# Patient Record
Sex: Male | Born: 1970 | Race: White | Hispanic: No | Marital: Single | Smoking: Never smoker
Health system: Southern US, Community
[De-identification: ages and names within clinical notes are randomized; demographics above are authoritative.]

## PROBLEM LIST (undated history)

## (undated) DIAGNOSIS — M25511 Pain in right shoulder: Secondary | ICD-10-CM

## (undated) DIAGNOSIS — I10 Essential (primary) hypertension: Secondary | ICD-10-CM

## (undated) HISTORY — PX: HERNIA REPAIR: SHX51

## (undated) HISTORY — PX: INSERTION, PULSE GENERATOR, PERIPHERAL NERVE STIMULATOR: SHX7301

## (undated) HISTORY — PX: CATARACT EXTRACTION: SUR2

## (undated) HISTORY — PX: SHOULDER FUSION SURGERY: SHX775

## (undated) HISTORY — PX: CORNEAL TRANSPLANT: SHX108

---

## 1997-12-26 ENCOUNTER — Emergency Department (HOSPITAL_COMMUNITY): Admission: EM | Admit: 1997-12-26 | Discharge: 1997-12-26 | Payer: Self-pay | Admitting: Emergency Medicine

## 1998-01-01 ENCOUNTER — Emergency Department (HOSPITAL_COMMUNITY): Admission: EM | Admit: 1998-01-01 | Discharge: 1998-01-01 | Payer: Self-pay | Admitting: Emergency Medicine

## 1998-01-08 ENCOUNTER — Emergency Department (HOSPITAL_COMMUNITY): Admission: EM | Admit: 1998-01-08 | Discharge: 1998-01-08 | Payer: Self-pay | Admitting: Emergency Medicine

## 1998-01-12 ENCOUNTER — Emergency Department (HOSPITAL_COMMUNITY): Admission: EM | Admit: 1998-01-12 | Discharge: 1998-01-12 | Payer: Self-pay | Admitting: *Deleted

## 1998-02-12 ENCOUNTER — Emergency Department (HOSPITAL_COMMUNITY): Admission: EM | Admit: 1998-02-12 | Discharge: 1998-02-12 | Payer: Self-pay | Admitting: Emergency Medicine

## 1998-02-19 ENCOUNTER — Emergency Department (HOSPITAL_COMMUNITY): Admission: EM | Admit: 1998-02-19 | Discharge: 1998-02-19 | Payer: Self-pay | Admitting: Emergency Medicine

## 1998-04-15 ENCOUNTER — Emergency Department (HOSPITAL_COMMUNITY): Admission: EM | Admit: 1998-04-15 | Discharge: 1998-04-15 | Payer: Self-pay | Admitting: Emergency Medicine

## 1998-07-21 ENCOUNTER — Encounter: Payer: Self-pay | Admitting: Emergency Medicine

## 1998-07-21 ENCOUNTER — Emergency Department (HOSPITAL_COMMUNITY): Admission: EM | Admit: 1998-07-21 | Discharge: 1998-07-21 | Payer: Self-pay | Admitting: Emergency Medicine

## 1998-11-21 ENCOUNTER — Encounter: Payer: Self-pay | Admitting: Emergency Medicine

## 1998-11-21 ENCOUNTER — Emergency Department (HOSPITAL_COMMUNITY): Admission: EM | Admit: 1998-11-21 | Discharge: 1998-11-21 | Payer: Self-pay | Admitting: Emergency Medicine

## 1999-01-29 ENCOUNTER — Emergency Department (HOSPITAL_COMMUNITY): Admission: EM | Admit: 1999-01-29 | Discharge: 1999-01-29 | Payer: Self-pay | Admitting: Emergency Medicine

## 1999-03-30 ENCOUNTER — Ambulatory Visit (HOSPITAL_COMMUNITY): Admission: RE | Admit: 1999-03-30 | Discharge: 1999-03-30 | Payer: Self-pay | Admitting: Orthopedic Surgery

## 1999-03-30 ENCOUNTER — Encounter: Payer: Self-pay | Admitting: Orthopedic Surgery

## 1999-12-05 ENCOUNTER — Emergency Department (HOSPITAL_COMMUNITY): Admission: EM | Admit: 1999-12-05 | Discharge: 1999-12-05 | Payer: Self-pay | Admitting: Emergency Medicine

## 2001-10-23 ENCOUNTER — Emergency Department (HOSPITAL_COMMUNITY): Admission: EM | Admit: 2001-10-23 | Discharge: 2001-10-23 | Payer: Self-pay | Admitting: Emergency Medicine

## 2001-10-25 ENCOUNTER — Emergency Department (HOSPITAL_COMMUNITY): Admission: EM | Admit: 2001-10-25 | Discharge: 2001-10-25 | Payer: Self-pay | Admitting: Emergency Medicine

## 2001-10-31 ENCOUNTER — Emergency Department (HOSPITAL_COMMUNITY): Admission: EM | Admit: 2001-10-31 | Discharge: 2001-10-31 | Payer: Self-pay | Admitting: Emergency Medicine

## 2001-12-15 ENCOUNTER — Emergency Department (HOSPITAL_COMMUNITY): Admission: EM | Admit: 2001-12-15 | Discharge: 2001-12-15 | Payer: Self-pay | Admitting: Emergency Medicine

## 2002-04-08 ENCOUNTER — Encounter: Payer: Self-pay | Admitting: Emergency Medicine

## 2002-04-08 ENCOUNTER — Emergency Department (HOSPITAL_COMMUNITY): Admission: EM | Admit: 2002-04-08 | Discharge: 2002-04-09 | Payer: Self-pay | Admitting: Emergency Medicine

## 2003-07-22 ENCOUNTER — Encounter: Admission: RE | Admit: 2003-07-22 | Discharge: 2003-09-03 | Payer: Self-pay | Admitting: Orthopaedic Surgery

## 2004-02-01 ENCOUNTER — Emergency Department (HOSPITAL_COMMUNITY): Admission: EM | Admit: 2004-02-01 | Discharge: 2004-02-01 | Payer: Self-pay | Admitting: Emergency Medicine

## 2004-02-16 ENCOUNTER — Emergency Department (HOSPITAL_COMMUNITY): Admission: EM | Admit: 2004-02-16 | Discharge: 2004-02-16 | Payer: Self-pay | Admitting: Emergency Medicine

## 2004-03-06 ENCOUNTER — Ambulatory Visit (HOSPITAL_COMMUNITY): Admission: RE | Admit: 2004-03-06 | Discharge: 2004-03-06 | Payer: Self-pay | Admitting: Internal Medicine

## 2004-03-22 ENCOUNTER — Emergency Department (HOSPITAL_COMMUNITY): Admission: AD | Admit: 2004-03-22 | Discharge: 2004-03-22 | Payer: Self-pay | Admitting: Emergency Medicine

## 2004-10-06 ENCOUNTER — Emergency Department (HOSPITAL_COMMUNITY): Admission: EM | Admit: 2004-10-06 | Discharge: 2004-10-06 | Payer: Self-pay | Admitting: Emergency Medicine

## 2004-12-29 ENCOUNTER — Ambulatory Visit (HOSPITAL_COMMUNITY): Admission: RE | Admit: 2004-12-29 | Discharge: 2004-12-29 | Payer: Self-pay | Admitting: *Deleted

## 2006-06-06 ENCOUNTER — Encounter: Admission: RE | Admit: 2006-06-06 | Discharge: 2006-09-04 | Payer: Self-pay

## 2006-10-03 ENCOUNTER — Encounter
Admission: RE | Admit: 2006-10-03 | Discharge: 2007-01-01 | Payer: Self-pay | Admitting: Physical Medicine and Rehabilitation

## 2006-11-24 ENCOUNTER — Ambulatory Visit (HOSPITAL_BASED_OUTPATIENT_CLINIC_OR_DEPARTMENT_OTHER): Admission: RE | Admit: 2006-11-24 | Discharge: 2006-11-24 | Payer: Self-pay | Admitting: Surgery

## 2008-12-11 ENCOUNTER — Emergency Department (HOSPITAL_BASED_OUTPATIENT_CLINIC_OR_DEPARTMENT_OTHER): Admission: EM | Admit: 2008-12-11 | Discharge: 2008-12-11 | Payer: Self-pay | Admitting: Emergency Medicine

## 2008-12-11 ENCOUNTER — Ambulatory Visit: Payer: Self-pay | Admitting: Diagnostic Radiology

## 2010-12-18 NOTE — Op Note (Signed)
NAME:  Nathaniel Estrada, Nathaniel Estrada NO.:  [id number]   MEDICAL RECORD NO.:  [id number]          PATIENT TYPE:  AMB   LOCATION:  DSC                          FACILITY:  [facility]   PHYSICIAN:  Currie Paris, M.D.DATE OF BIRTH:  Sep 23, 1970   DATE OF PROCEDURE:  11/24/2006  DATE OF DISCHARGE:                               OPERATIVE REPORT   PREOPERATIVE DIAGNOSIS:  Right inguinal hernia.   POSTOPERATIVE DIAGNOSIS:  Right inguinal hernia.   OPERATION:  Repair of right inguinal hernia with mesh.   SURGEON:  Currie Paris, M.D.   ANESTHESIA:  General.   CLINICAL HISTORY:  This is a 40 year old gentleman who was recently  found to have a small right inguinal hernia, actually documented  initially on CT scan which contained a little bit of fat.  Because of  some mild symptoms, he wished to go ahead and have this repaired.   DESCRIPTION OF PROCEDURE:  The patient was seen in the holding area and  he had no further questions.  We identified and marked the right  inguinal area as the operative site.  The patient was taken to the  operating room and after satisfactory general anesthesia had been  obtained, the inguinal area was clipped, prepped and draped.  The time-  out occurred.   I infiltrated 0.25% plain Marcaine to help with postop analgesia.  I  infiltrated the skin incision line and at right angles to that and then  subfascially near the anterior superior iliac spine.  Additional local  was infiltrated throughout the case to be sure we had all the tissues  fully infiltrated with local.   An incision was made and deepened to the external oblique aponeurosis  with bleeders either coagulated or tied with 4-0 Vicryl.  The external  oblique was opened in line with its fibers and dissected up off the  underlying structures.  The cord was identified and dissected up and  surrounded with a Penrose drain.  I freed the cord up so that there were  no flimsy attachments and  it was well off the inguinal floor.  The  inguinal floor was markedly attenuated with a direct inguinal hernia  present.  In addition, there was preperitoneal fat protruding through  the internal ring producing significant bulging, but there was no true  peritoneal hernia sac at the internal ring.   I reduced the preperitoneal fat and then put a single suture medial to  the cord to tighten up the internal ring.  I then placed a piece of  Marlex mesh cut to fit and split to go around the cord.  This was  sutured in with 2-0 Prolene starting at the pubic tubercle and running  along the inferior aspect until I got to the level of the deep ring.  This laid flat over the internal oblique and tucked down.  The cords  went around the internal ring and the tails went out well lateral.  Everything seemed to lay down smoothly.  There was plenty of room for  the cord to come through without any  significant tightening that I  thought would restrict blood flow to the testes.   I infiltrated more local, as noted above.  I then closed in layers using  3-0 Vicryl to close the external oblique and Scarpa's and 4-0 Monocryl  subcuticular plus Dermabond for the skin.  The patient tolerated the  procedure well.  There were no operative complications and all counts  were correct.      Currie Paris, M.D.  Electronically Signed     CJS/MEDQ  D:  11/24/2006  T:  11/24/2006  Job:  [id number]   cc:   Royetta Crochet, MD

## 2013-02-21 ENCOUNTER — Ambulatory Visit
Admission: RE | Admit: 2013-02-21 | Discharge: 2013-02-21 | Disposition: A | Discharge status: Home / Self Care | Payer: Medicare Other | Source: Ambulatory Visit | Attending: Family Medicine | Admitting: Family Medicine

## 2013-02-21 ENCOUNTER — Other Ambulatory Visit: Payer: Self-pay | Admitting: Family Medicine

## 2013-02-21 DIAGNOSIS — M25571 Pain in right ankle and joints of right foot: Secondary | ICD-10-CM

## 2015-03-14 DIAGNOSIS — Z79891 Long term (current) use of opiate analgesic: Secondary | ICD-10-CM | POA: Diagnosis not present

## 2015-03-14 DIAGNOSIS — M791 Myalgia: Secondary | ICD-10-CM | POA: Diagnosis not present

## 2015-03-14 DIAGNOSIS — M25511 Pain in right shoulder: Secondary | ICD-10-CM | POA: Diagnosis not present

## 2015-03-14 DIAGNOSIS — F1120 Opioid dependence, uncomplicated: Secondary | ICD-10-CM | POA: Diagnosis not present

## 2015-03-18 DIAGNOSIS — I10 Essential (primary) hypertension: Secondary | ICD-10-CM | POA: Diagnosis not present

## 2015-03-18 DIAGNOSIS — J45998 Other asthma: Secondary | ICD-10-CM | POA: Diagnosis not present

## 2015-03-18 DIAGNOSIS — R7309 Other abnormal glucose: Secondary | ICD-10-CM | POA: Diagnosis not present

## 2015-03-18 DIAGNOSIS — G4700 Insomnia, unspecified: Secondary | ICD-10-CM | POA: Diagnosis not present

## 2015-03-18 DIAGNOSIS — E784 Other hyperlipidemia: Secondary | ICD-10-CM | POA: Diagnosis not present

## 2015-05-09 DIAGNOSIS — M791 Myalgia: Secondary | ICD-10-CM | POA: Diagnosis not present

## 2015-05-09 DIAGNOSIS — M25511 Pain in right shoulder: Secondary | ICD-10-CM | POA: Diagnosis not present

## 2015-05-27 DIAGNOSIS — G4700 Insomnia, unspecified: Secondary | ICD-10-CM | POA: Diagnosis not present

## 2015-05-27 DIAGNOSIS — Z7689 Persons encountering health services in other specified circumstances: Secondary | ICD-10-CM | POA: Diagnosis not present

## 2015-05-27 DIAGNOSIS — R0602 Shortness of breath: Secondary | ICD-10-CM | POA: Diagnosis not present

## 2015-05-27 DIAGNOSIS — J069 Acute upper respiratory infection, unspecified: Secondary | ICD-10-CM | POA: Diagnosis not present

## 2015-05-27 DIAGNOSIS — R110 Nausea: Secondary | ICD-10-CM | POA: Diagnosis not present

## 2015-05-27 DIAGNOSIS — E784 Other hyperlipidemia: Secondary | ICD-10-CM | POA: Diagnosis not present

## 2015-05-27 DIAGNOSIS — I10 Essential (primary) hypertension: Secondary | ICD-10-CM | POA: Diagnosis not present

## 2015-05-27 DIAGNOSIS — R7309 Other abnormal glucose: Secondary | ICD-10-CM | POA: Diagnosis not present

## 2015-05-27 DIAGNOSIS — J45998 Other asthma: Secondary | ICD-10-CM | POA: Diagnosis not present

## 2015-06-10 DIAGNOSIS — R739 Hyperglycemia, unspecified: Secondary | ICD-10-CM | POA: Diagnosis not present

## 2015-06-10 DIAGNOSIS — I10 Essential (primary) hypertension: Secondary | ICD-10-CM | POA: Diagnosis not present

## 2015-06-10 DIAGNOSIS — G4700 Insomnia, unspecified: Secondary | ICD-10-CM | POA: Diagnosis not present

## 2015-06-10 DIAGNOSIS — E784 Other hyperlipidemia: Secondary | ICD-10-CM | POA: Diagnosis not present

## 2015-06-10 DIAGNOSIS — J329 Chronic sinusitis, unspecified: Secondary | ICD-10-CM | POA: Diagnosis not present

## 2015-06-16 DIAGNOSIS — I10 Essential (primary) hypertension: Secondary | ICD-10-CM | POA: Diagnosis not present

## 2015-06-16 DIAGNOSIS — R7301 Impaired fasting glucose: Secondary | ICD-10-CM | POA: Diagnosis not present

## 2015-06-16 DIAGNOSIS — J209 Acute bronchitis, unspecified: Secondary | ICD-10-CM | POA: Diagnosis not present

## 2015-06-16 DIAGNOSIS — J45998 Other asthma: Secondary | ICD-10-CM | POA: Diagnosis not present

## 2015-06-16 DIAGNOSIS — E784 Other hyperlipidemia: Secondary | ICD-10-CM | POA: Diagnosis not present

## 2015-07-18 DIAGNOSIS — M791 Myalgia: Secondary | ICD-10-CM | POA: Diagnosis not present

## 2015-07-18 DIAGNOSIS — M25511 Pain in right shoulder: Secondary | ICD-10-CM | POA: Diagnosis not present

## 2015-07-18 DIAGNOSIS — Z79891 Long term (current) use of opiate analgesic: Secondary | ICD-10-CM | POA: Diagnosis not present

## 2015-07-21 DIAGNOSIS — M25511 Pain in right shoulder: Secondary | ICD-10-CM | POA: Diagnosis not present

## 2015-08-28 DIAGNOSIS — I10 Essential (primary) hypertension: Secondary | ICD-10-CM | POA: Diagnosis not present

## 2015-08-28 DIAGNOSIS — M25512 Pain in left shoulder: Secondary | ICD-10-CM | POA: Diagnosis not present

## 2015-08-28 DIAGNOSIS — J45998 Other asthma: Secondary | ICD-10-CM | POA: Diagnosis not present

## 2015-08-28 DIAGNOSIS — Z23 Encounter for immunization: Secondary | ICD-10-CM | POA: Diagnosis not present

## 2015-08-28 DIAGNOSIS — R7301 Impaired fasting glucose: Secondary | ICD-10-CM | POA: Diagnosis not present

## 2015-08-28 DIAGNOSIS — R739 Hyperglycemia, unspecified: Secondary | ICD-10-CM | POA: Diagnosis not present

## 2015-08-28 DIAGNOSIS — E784 Other hyperlipidemia: Secondary | ICD-10-CM | POA: Diagnosis not present

## 2015-09-03 DIAGNOSIS — M25511 Pain in right shoulder: Secondary | ICD-10-CM | POA: Diagnosis not present

## 2015-09-03 DIAGNOSIS — G894 Chronic pain syndrome: Secondary | ICD-10-CM | POA: Diagnosis not present

## 2015-09-03 DIAGNOSIS — G8929 Other chronic pain: Secondary | ICD-10-CM | POA: Diagnosis not present

## 2015-09-03 DIAGNOSIS — M79641 Pain in right hand: Secondary | ICD-10-CM | POA: Diagnosis not present

## 2015-09-03 DIAGNOSIS — M79631 Pain in right forearm: Secondary | ICD-10-CM | POA: Diagnosis not present

## 2015-09-18 DIAGNOSIS — R739 Hyperglycemia, unspecified: Secondary | ICD-10-CM | POA: Diagnosis not present

## 2015-09-18 DIAGNOSIS — E784 Other hyperlipidemia: Secondary | ICD-10-CM | POA: Diagnosis not present

## 2015-09-18 DIAGNOSIS — I10 Essential (primary) hypertension: Secondary | ICD-10-CM | POA: Diagnosis not present

## 2015-09-18 DIAGNOSIS — R740 Nonspecific elevation of levels of transaminase and lactic acid dehydrogenase [LDH]: Secondary | ICD-10-CM | POA: Diagnosis not present

## 2015-09-18 DIAGNOSIS — M25512 Pain in left shoulder: Secondary | ICD-10-CM | POA: Diagnosis not present

## 2015-09-18 DIAGNOSIS — J45998 Other asthma: Secondary | ICD-10-CM | POA: Diagnosis not present

## 2015-10-09 DIAGNOSIS — M79672 Pain in left foot: Secondary | ICD-10-CM | POA: Diagnosis not present

## 2015-10-09 DIAGNOSIS — M25512 Pain in left shoulder: Secondary | ICD-10-CM | POA: Diagnosis not present

## 2015-10-09 DIAGNOSIS — I10 Essential (primary) hypertension: Secondary | ICD-10-CM | POA: Diagnosis not present

## 2015-10-09 DIAGNOSIS — M2550 Pain in unspecified joint: Secondary | ICD-10-CM | POA: Diagnosis not present

## 2015-10-09 DIAGNOSIS — R740 Nonspecific elevation of levels of transaminase and lactic acid dehydrogenase [LDH]: Secondary | ICD-10-CM | POA: Diagnosis not present

## 2015-10-09 DIAGNOSIS — J45998 Other asthma: Secondary | ICD-10-CM | POA: Diagnosis not present

## 2015-10-09 DIAGNOSIS — E784 Other hyperlipidemia: Secondary | ICD-10-CM | POA: Diagnosis not present

## 2015-10-09 DIAGNOSIS — R739 Hyperglycemia, unspecified: Secondary | ICD-10-CM | POA: Diagnosis not present

## 2015-10-15 DIAGNOSIS — G894 Chronic pain syndrome: Secondary | ICD-10-CM | POA: Diagnosis not present

## 2015-10-15 DIAGNOSIS — G8911 Acute pain due to trauma: Secondary | ICD-10-CM | POA: Diagnosis not present

## 2015-10-29 DIAGNOSIS — G8911 Acute pain due to trauma: Secondary | ICD-10-CM | POA: Diagnosis not present

## 2015-10-29 DIAGNOSIS — G894 Chronic pain syndrome: Secondary | ICD-10-CM | POA: Diagnosis not present

## 2015-11-27 DIAGNOSIS — M25511 Pain in right shoulder: Secondary | ICD-10-CM | POA: Diagnosis not present

## 2015-11-27 DIAGNOSIS — G894 Chronic pain syndrome: Secondary | ICD-10-CM | POA: Diagnosis not present

## 2015-12-18 DIAGNOSIS — R740 Nonspecific elevation of levels of transaminase and lactic acid dehydrogenase [LDH]: Secondary | ICD-10-CM | POA: Diagnosis not present

## 2015-12-18 DIAGNOSIS — I10 Essential (primary) hypertension: Secondary | ICD-10-CM | POA: Diagnosis not present

## 2015-12-18 DIAGNOSIS — J029 Acute pharyngitis, unspecified: Secondary | ICD-10-CM | POA: Diagnosis not present

## 2015-12-18 DIAGNOSIS — E784 Other hyperlipidemia: Secondary | ICD-10-CM | POA: Diagnosis not present

## 2015-12-18 DIAGNOSIS — J45998 Other asthma: Secondary | ICD-10-CM | POA: Diagnosis not present

## 2015-12-18 DIAGNOSIS — E559 Vitamin D deficiency, unspecified: Secondary | ICD-10-CM | POA: Diagnosis not present

## 2015-12-31 DIAGNOSIS — M25511 Pain in right shoulder: Secondary | ICD-10-CM | POA: Diagnosis not present

## 2015-12-31 DIAGNOSIS — G894 Chronic pain syndrome: Secondary | ICD-10-CM | POA: Diagnosis not present

## 2016-01-28 DIAGNOSIS — M25511 Pain in right shoulder: Secondary | ICD-10-CM | POA: Diagnosis not present

## 2016-01-28 DIAGNOSIS — G894 Chronic pain syndrome: Secondary | ICD-10-CM | POA: Diagnosis not present

## 2016-03-03 DIAGNOSIS — G894 Chronic pain syndrome: Secondary | ICD-10-CM | POA: Diagnosis not present

## 2016-03-03 DIAGNOSIS — M150 Primary generalized (osteo)arthritis: Secondary | ICD-10-CM | POA: Diagnosis not present

## 2016-03-03 DIAGNOSIS — M25511 Pain in right shoulder: Secondary | ICD-10-CM | POA: Diagnosis not present

## 2016-03-03 DIAGNOSIS — M545 Low back pain: Secondary | ICD-10-CM | POA: Diagnosis not present

## 2016-04-01 DIAGNOSIS — G894 Chronic pain syndrome: Secondary | ICD-10-CM | POA: Diagnosis not present

## 2016-04-01 DIAGNOSIS — M25519 Pain in unspecified shoulder: Secondary | ICD-10-CM | POA: Diagnosis not present

## 2016-04-01 DIAGNOSIS — M25511 Pain in right shoulder: Secondary | ICD-10-CM | POA: Diagnosis not present

## 2016-04-08 DIAGNOSIS — J45998 Other asthma: Secondary | ICD-10-CM | POA: Diagnosis not present

## 2016-04-08 DIAGNOSIS — I10 Essential (primary) hypertension: Secondary | ICD-10-CM | POA: Diagnosis not present

## 2016-04-08 DIAGNOSIS — R740 Nonspecific elevation of levels of transaminase and lactic acid dehydrogenase [LDH]: Secondary | ICD-10-CM | POA: Diagnosis not present

## 2016-04-08 DIAGNOSIS — E559 Vitamin D deficiency, unspecified: Secondary | ICD-10-CM | POA: Diagnosis not present

## 2016-04-08 DIAGNOSIS — E784 Other hyperlipidemia: Secondary | ICD-10-CM | POA: Diagnosis not present

## 2016-04-28 DIAGNOSIS — M25519 Pain in unspecified shoulder: Secondary | ICD-10-CM | POA: Diagnosis not present

## 2016-04-28 DIAGNOSIS — M25512 Pain in left shoulder: Secondary | ICD-10-CM | POA: Diagnosis not present

## 2016-04-28 DIAGNOSIS — G894 Chronic pain syndrome: Secondary | ICD-10-CM | POA: Diagnosis not present

## 2016-05-31 DIAGNOSIS — M25519 Pain in unspecified shoulder: Secondary | ICD-10-CM | POA: Diagnosis not present

## 2016-05-31 DIAGNOSIS — G894 Chronic pain syndrome: Secondary | ICD-10-CM | POA: Diagnosis not present

## 2016-07-02 DIAGNOSIS — G894 Chronic pain syndrome: Secondary | ICD-10-CM | POA: Diagnosis not present

## 2016-07-02 DIAGNOSIS — M25511 Pain in right shoulder: Secondary | ICD-10-CM | POA: Diagnosis not present

## 2017-06-29 ENCOUNTER — Ambulatory Visit: Payer: Medicare Other | Admitting: Physical Therapy

## 2017-07-04 ENCOUNTER — Encounter: Payer: Self-pay | Admitting: Physical Therapy

## 2017-07-04 ENCOUNTER — Ambulatory Visit: Payer: Medicare Other | Attending: Orthopedic Surgery | Admitting: Physical Therapy

## 2017-07-04 DIAGNOSIS — R262 Difficulty in walking, not elsewhere classified: Secondary | ICD-10-CM | POA: Insufficient documentation

## 2017-07-04 DIAGNOSIS — M25572 Pain in left ankle and joints of left foot: Secondary | ICD-10-CM

## 2017-07-04 DIAGNOSIS — R600 Localized edema: Secondary | ICD-10-CM | POA: Diagnosis present

## 2017-07-04 NOTE — Therapy (Signed)
[facility] [address] [place], [place], [address] Phone: [phone]   Fax:  [phone]  Physical Therapy Evaluation  Patient Details  Name: Nathaniel Estrada MRN: [id number] Date of Birth: 11-Sep-1970 Referring Provider: Jamelle HaringSnow Daws   Encounter Date: 07/04/2017  PT End of Session - 07/04/17 1040    Visit Number  1    Date for PT Re-Evaluation  09/04/17    PT Start Time  1015    PT Stop Time  1101    PT Time Calculation (min)  46 min    Activity Tolerance  Patient tolerated treatment well    Behavior During Therapy  [facility] for tasks assessed/performed       History reviewed. No pertinent past medical history.  History reviewed. No pertinent surgical history.  There were no vitals filed for this visit.   Subjective Assessment - 07/04/17 1013    Subjective  Patient reports that he had plantar fascitis about 5-6 years ago, he reports that he underwent about 3 surgeries on the foot, he reports that he had some issues since that time.  He reports that recently he sprained the left ankle/foot twice in the past 2 months.  He has been in a cam boot for the past 2 weeks.  Reports a little less pain while walking in the boot    Pertinent History  HTN, right shoulder surgeries    Limitations  Walking;Standing;House hold activities    Patient Stated Goals  walk better, have less pain    Currently in Pain?  Yes    Pain Score  5     Pain Location  Ankle    Pain Orientation  Left    Pain Descriptors / Indicators  Aching;Sore    Pain Type  Acute pain    Pain Onset  More than a month ago    Pain Frequency  Constant    Aggravating Factors   standing, walking, pain up to 10/10    Pain Relieving Factors  pain meds, being in boot, rest, pain at best pain a 4-5/10    Effect of Pain on Daily Activities  limits everything         [facility] PT Assessment - 07/04/17 0001      Assessment   Medical Diagnosis  left ankle sprain    Referring  Provider  Jamelle HaringSnow Daws    Onset Date/Surgical Date  06/04/17    Prior Therapy  none      Precautions   Precautions  None      Balance Screen   Has the patient fallen in the past 6 months  No    Has the patient had a decrease in activity level because of a fear of falling?   No    Is the patient reluctant to leave their home because of a fear of falling?   No      Home Environment   Additional Comments  3 steps into the home, does some housework      Prior Function   Level of Independence  Independent with community mobility with device    Vocation  On disability    Vocation Requirements  right shoulder disability    Leisure  reports that he has lost about 100# in the past 2 years, he reports that he was walking      Observation/Other Assessments-Edema    Edema  Circumferential      Circumferential Edema   Circumferential -  Right  30cm mid foot    Circumferential - Left   33 cm mid foot      Posture/Postural Control   Posture Comments  fwd head, rounded shoulders      ROM / Strength   AROM / PROM / Strength  AROM;PROM;Strength      AROM   AROM Assessment Site  Ankle    Right/Left Ankle  Left    Left Ankle Dorsiflexion  5    Left Ankle Plantar Flexion  30    Left Ankle Inversion  10    Left Ankle Eversion  5      PROM   Overall PROM Comments  very painful PROM    PROM Assessment Site  Ankle    Right/Left Ankle  Left    Left Ankle Dorsiflexion  10    Left Ankle Plantar Flexion  35    Left Ankle Inversion  15    Left Ankle Eversion  10      Strength   Overall Strength Comments  4-/5      Flexibility   Soft Tissue Assessment /Muscle Length  -- tight calf and platar foot      Palpation   Palpation comment  left arch collapses in, large area of swelling just posterior to the navicular , he is very tender to palpation in the left medial foot and ankle and plantar foot      Ambulation/Gait   Gait Comments  uses a SPC at time, not with him today, wears the cam boot,  some antalgia on the left, without boot much moor ER of the foot, and more antalgic      Standardized Balance Assessment   Standardized Balance Assessment  Timed Up and Go Test      Timed Up and Go Test   Normal TUG (seconds)  16             Objective measurements completed on examination: See above findings.      [facility] Adult PT Treatment/Exercise - 07/04/17 0001      Modalities   Modalities  Vasopneumatic      Vasopneumatic   Number Minutes Vasopneumatic   15 minutes    Vasopnuematic Location   Ankle    Vasopneumatic Pressure  Medium    Vasopneumatic Temperature   33               PT Short Term Goals - 07/04/17 1046      PT SHORT TERM GOAL #1   Title  issue HEP    Time  1    Period  Weeks    Status  New        PT Long Term Goals - 07/04/17 1046      PT LONG TERM GOAL #1   Title  decrease pain 25%    Time  8    Period  Weeks    Status  New      PT LONG TERM GOAL #2   Title  walk most distances without boot    Time  8    Period  Weeks    Status  New      PT LONG TERM GOAL #3   Title  increase DF to 12 degrees    Time  8    Period  Weeks    Status  New      PT LONG TERM GOAL #4   Title  do housework without pain >5/10 in the ankle  Time  8    Period  Weeks    Status  New             Plan - 07/04/17 1042    Clinical Impression Statement  Patient reports left foot issues starting about 6 years ago, reports that he had 3 surgeries on the plantar fascia, reports that he continued to have some issues but was walking and doing well wihtout pain, until about 1-2 months ago he has had two instances of twisting the ankle.  He reports now much more pain, x-rays negative.  Has a collapsed arch, a lot of swelling just posterior to the navicular.  Difficulty walking, pain a 8-9/10 with walking in the boot    Clinical Presentation  Stable    Clinical Decision Making  Moderate    Rehab Potential  Fair    Clinical Impairments Affecting Rehab  Potential  has a history of multiple surgeries on the right shoulder with limited ROM and strength    PT Frequency  2x / week    PT Duration  8 weeks    PT Treatment/Interventions  Cryotherapy;Electrical Stimulation;Iontophoresis 4mg /ml Dexamethasone;Neuromuscular re-education;Therapeutic exercise;Balance training;Therapeutic activities;Functional mobility training;Stair training;Patient/family education;Manual techniques;Vasopneumatic Device;Taping    PT Next Visit Plan  issue HEP, slowly start some exercises, see if vaso helped, could try estim as well    Consulted and Agree with Plan of Care  Patient       Patient will benefit from skilled therapeutic intervention in order to improve the following deficits and impairments:  Abnormal gait, Decreased range of motion, Difficulty walking, Pain, Decreased mobility, Decreased strength, Increased edema, Impaired flexibility  Visit Diagnosis: Pain in left ankle and joints of left foot - Plan: PT plan of care cert/re-cert  Difficulty in walking, not elsewhere classified - Plan: PT plan of care cert/re-cert  Localized edema - Plan: PT plan of care cert/re-cert  G-Codes - 07/04/17 1055    Functional Assessment Tool Used (Outpatient Only)  foto 70% limitation    Functional Limitation  Mobility: Walking and moving around    Mobility: Walking and Moving Around Current Status ([id number])  At least 60 percent but less than 80 percent impaired, limited or restricted    Mobility: Walking and Moving Around Goal Status (U0454(G8979)  At least 40 percent but less than 60 percent impaired, limited or restricted        Problem List There are no active problems to display for this patient.   Jearld LeschALBRIGHT,Nathania Waldman W., PT 07/04/2017, 10:59 AM  [facility] [address] [address] [place], [place], [address] Phone: [phone]   Fax:  [phone]  Name: Nathaniel Estrada MRN: [id number] Date of Birth: 1971/07/27

## 2017-07-05 NOTE — Therapy (Addendum)
D/C patient did not return since the evaluation

## 2017-07-06 ENCOUNTER — Ambulatory Visit: Payer: Medicare Other | Admitting: Physical Therapy

## 2017-07-12 ENCOUNTER — Ambulatory Visit: Payer: Medicare Other | Admitting: Physical Therapy

## 2017-07-14 ENCOUNTER — Ambulatory Visit: Payer: Medicare Other | Admitting: Physical Therapy

## 2021-04-23 ENCOUNTER — Emergency Department (HOSPITAL_COMMUNITY)
Admission: EM | Admit: 2021-04-23 | Discharge: 2021-04-24 | Disposition: A | Discharge status: Home / Self Care | Payer: Medicare Other | Attending: Emergency Medicine | Admitting: Emergency Medicine

## 2021-04-23 ENCOUNTER — Emergency Department (HOSPITAL_COMMUNITY): Payer: Medicare Other

## 2021-04-23 DIAGNOSIS — Z20822 Contact with and (suspected) exposure to covid-19: Secondary | ICD-10-CM | POA: Insufficient documentation

## 2021-04-23 DIAGNOSIS — I10 Essential (primary) hypertension: Secondary | ICD-10-CM | POA: Diagnosis not present

## 2021-04-23 DIAGNOSIS — R079 Chest pain, unspecified: Secondary | ICD-10-CM | POA: Diagnosis not present

## 2021-04-23 LAB — CBC WITH DIFFERENTIAL/PLATELET
Abs Immature Granulocytes: 0.03 10*3/uL (ref 0.00–0.07)
Basophils Absolute: 0 10*3/uL (ref 0.0–0.1)
Basophils Relative: 0 %
Eosinophils Absolute: 0.1 10*3/uL (ref 0.0–0.5)
Eosinophils Relative: 1 %
HCT: 42.1 % (ref 39.0–52.0)
Hemoglobin: 13.3 g/dL (ref 13.0–17.0)
Immature Granulocytes: 0 %
Lymphocytes Relative: 24 %
Lymphs Abs: 2.2 10*3/uL (ref 0.7–4.0)
MCH: 27.4 pg (ref 26.0–34.0)
MCHC: 31.6 g/dL (ref 30.0–36.0)
MCV: 86.6 fL (ref 80.0–100.0)
Monocytes Absolute: 0.8 10*3/uL (ref 0.1–1.0)
Monocytes Relative: 9 %
Neutro Abs: 6.2 10*3/uL (ref 1.7–7.7)
Neutrophils Relative %: 66 %
Platelets: 295 10*3/uL (ref 150–400)
RBC: 4.86 MIL/uL (ref 4.22–5.81)
RDW: 14.1 % (ref 11.5–15.5)
WBC: 9.4 10*3/uL (ref 4.0–10.5)
nRBC: 0 % (ref 0.0–0.2)

## 2021-04-23 MED ORDER — ALUM & MAG HYDROXIDE-SIMETH 200-200-20 MG/5ML PO SUSP
30.0000 mL | Freq: Once | ORAL | Status: AC
  Administered 2021-04-23: 30 mL via ORAL
  Filled 2021-04-23: qty 30

## 2021-04-23 MED ORDER — LIDOCAINE VISCOUS HCL 2 % MT SOLN
15.0000 mL | Freq: Once | OROMUCOSAL | Status: DC
  Administered 2021-04-23: 15 mL via ORAL
  Filled 2021-04-23: qty 15

## 2021-04-23 NOTE — ED Provider Notes (Signed)
[facility] EMERGENCY DEPARTMENT Provider Note   CSN: [id number] Arrival date & time: 04/23/21  2125     History Chief Complaint  Patient presents with   Chest Pain    Nathaniel Estrada is a 50 y.o. male.   Chest Pain Pain location:  L chest Pain quality: pressure and sharp   Pain radiates to:  L arm Pain severity:  Moderate Onset quality:  Sudden Duration:  1 hour Timing:  Constant Progression:  Unchanged Chronicity:  New Context: at rest   Relieved by:  Nothing Worsened by:  Nothing Ineffective treatments:  Aspirin Associated symptoms: no abdominal pain, no back pain, no cough, no fever, no palpitations, no shortness of breath and no vomiting   Risk factors: hypertension and male sex    50 year old male with a history of hypertension, GERD, chronic pain, chronic right shoulder pain, follows with pain clinic and was seen today in clinic who presents emergency department with chest pain.  The patient states that about 1 hour prior to arrival, he developed chest pain in the left side of his chest.  He describes it as both a squeezing sensation but also a sharp pain.  He states the pain radiates down to his left arm.  He took 5 baby aspirin at home prior to arrival.  He denies any cough.  He denies any fever or chills.  He denies any sick contacts.  No past medical history on file.  There are no problems to display for this patient.   No past surgical history on file.     No family history on file.     Home Medications Prior to Admission medications   Not on File    Allergies    Patient has no allergy information on record.  Review of Systems   Review of Systems  Constitutional:  Negative for chills and fever.  HENT:  Negative for ear pain and sore throat.   Eyes:  Negative for pain and visual disturbance.  Respiratory:  Negative for cough and shortness of breath.   Cardiovascular:  Positive for chest pain. Negative for palpitations.   Gastrointestinal:  Negative for abdominal pain and vomiting.  Genitourinary:  Negative for dysuria and hematuria.  Musculoskeletal:  Negative for arthralgias and back pain.  Skin:  Negative for color change and rash.  Neurological:  Negative for seizures and syncope.  All other systems reviewed and are negative.  Physical Exam Updated Vital Signs BP 117/75 (BP Location: Left Arm)   Pulse 94   Temp 98.9 F (37.2 C) (Oral)   Resp 18   SpO2 100%   Physical Exam Vitals and nursing note reviewed.  Constitutional:      General: He is not in acute distress.    Appearance: He is well-developed. He is obese. He is not ill-appearing or diaphoretic.  HENT:     Head: Normocephalic and atraumatic.  Eyes:     Conjunctiva/sclera: Conjunctivae normal.  Cardiovascular:     Rate and Rhythm: Normal rate and regular rhythm.     Heart sounds: No murmur heard. Pulmonary:     Effort: Pulmonary effort is normal. No respiratory distress.     Breath sounds: Normal breath sounds.  Abdominal:     Palpations: Abdomen is soft.     Tenderness: There is no abdominal tenderness.  Musculoskeletal:     Cervical back: Neck supple.     Right lower leg: No edema.     Left lower leg: No edema.  Skin:    General: Skin is warm and dry.  Neurological:     General: No focal deficit present.     Mental Status: He is alert and oriented to person, place, and time.    ED Results / Procedures / Treatments   Labs (all labs ordered are listed, but only abnormal results are displayed) Labs Reviewed  RESP PANEL BY RT-PCR (FLU A&B, COVID) [id number]  BASIC METABOLIC PANEL  CBC WITH DIFFERENTIAL/PLATELET  HEPATIC FUNCTION PANEL  D-DIMER, QUANTITATIVE  TROPONIN I (HIGH SENSITIVITY)    EKG None  Radiology No results found.  Procedures Procedures   Medications Ordered in ED Medications  alum & mag hydroxide-simeth (MAALOX/MYLANTA) 200-200-20 MG/5ML suspension 30 mL (has no administration in time range)     And  lidocaine (XYLOCAINE) 2 % viscous mouth solution 15 mL (has no administration in time range)    ED Course  I have reviewed the triage vital signs and the nursing notes.  Pertinent labs & imaging results that were available during my care of the patient were reviewed by me and considered in my medical decision making (see chart for details).    MDM Rules/Calculators/A&P                           Nathaniel Estrada is a 50 y.o. male who presented to the Emergency Department c/o chest pain. Past medical records have been reviewed and are notable for hx of GERD.   Pertinent exam findings include:Lungs CTAB, no chest wall TTP, obese, no LE edema, no abdominal TTP.  EKG: Normal sinus rhythm with a rate of 91 and no evidence of acute ischemic changes, abnormal intervals, or dysrhythmia. No concerning change from prior   Differential diagnosis includes: ACS, pneumonia, pneumothorax, pulmonary embolism,pericarditis/myocarditis, GERD, PUD, musculoskeletal. HEART score of 3, lowrisk. Patient not given ASA 325 mg, not given nitroglycerin.    PERC positive. Well's score, low probability. D-dimer collected and pending. Will rule out ACS and PE with lab workup. Overall presentation is reassuring. Signout plan to follow-up labs and CXR and DC home pending results. Signout given to Dr. Pilar Plate at 2330. Marland Kitchen  Final Clinical Impression(s) / ED Diagnoses Final diagnoses:  None    Rx / DC Orders ED Discharge Orders     None        Ernie Avena, MD 04/24/21 [phone]

## 2021-04-23 NOTE — ED Notes (Signed)
Wife Armstrong Creasy [phone] would like an update

## 2021-04-23 NOTE — ED Triage Notes (Signed)
PT presents today from home after chest pain started about an hour ago. PT describes the pain as a squeeze in the left side of his chest with pain radiating to his left arm. Pt took 5 baby aspirin at home PTA. He also states he hasnt taken his potassium in 3 days.

## 2021-04-23 NOTE — ED Provider Notes (Signed)
  Provider Note MRN:  [id number]  Arrival date & time: 04/24/21    ED Course and Medical Decision Making  Assumed care from Dr. Karene Fry at shift change.  Chest pain, pressure/sharp, awaiting delta troponin, D-dimer.  Work-up is reassuring with troponin negative x2, D-dimer negative.  Patient continues to look well with normal vital signs, minimal chest pain at this time.  Heart score of 3, appropriate for discharge with follow-up.  Procedures  Final Clinical Impressions(s) / ED Diagnoses     ICD-10-CM   1. Chest pain, unspecified type  R07.9       ED Discharge Orders          Ordered    Ambulatory referral to Cardiology        04/24/21 0259              Discharge Instructions      You were evaluated in the Emergency Department and after careful evaluation, we did not find any emergent condition requiring admission or further testing in the hospital.  Your exam/testing today was overall reassuring.  Recommend follow-up with your primary care doctor and/or cardiology to further discuss your symptoms.  Please return to the Emergency Department if you experience any worsening of your condition.  Thank you for allowing [place] to be a part of your care.       Elmer Sow. Pilar Plate, MD [facility] Emergency Medicine [facility] [email or web address]    Sabas Sous, MD 04/24/21 0300

## 2021-04-23 NOTE — ED Notes (Signed)
GI cocktail made for pt and he states is is allergic to lidocaine. Will notify MD

## 2021-04-24 LAB — HEPATIC FUNCTION PANEL
ALT: 35 U/L (ref 0–44)
AST: 25 U/L (ref 15–41)
Albumin: 3.5 g/dL (ref 3.5–5.0)
Alkaline Phosphatase: 57 U/L (ref 38–126)
Bilirubin, Direct: 0.1 mg/dL (ref 0.0–0.2)
Indirect Bilirubin: 0.4 mg/dL (ref 0.3–0.9)
Total Bilirubin: 0.5 mg/dL (ref 0.3–1.2)
Total Protein: 6.7 g/dL (ref 6.5–8.1)

## 2021-04-24 LAB — TROPONIN I (HIGH SENSITIVITY)
Troponin I (High Sensitivity): 13 ng/L (ref <18)
Troponin I (High Sensitivity): 14 ng/L (ref <18)

## 2021-04-24 LAB — BASIC METABOLIC PANEL
Anion gap: 9 (ref 5–15)
BUN: 14 mg/dL (ref 6–20)
CO2: 24 mmol/L (ref 22–32)
Calcium: 9.1 mg/dL (ref 8.9–10.3)
Chloride: 104 mmol/L (ref 98–111)
Creatinine, Ser: 1.23 mg/dL (ref 0.61–1.24)
GFR, Estimated: >60 mL/min (ref >60)
Glucose, Bld: 81 mg/dL (ref 70–99)
Potassium: 2.9 mmol/L — ABNORMAL LOW (ref 3.5–5.1)
Sodium: 137 mmol/L (ref 135–145)

## 2021-04-24 LAB — RESP PANEL BY RT-PCR (FLU A&B, COVID) ARPGX2
Influenza A by PCR: NEGATIVE
Influenza B by PCR: NEGATIVE
SARS Coronavirus 2 by RT PCR: NEGATIVE

## 2021-04-24 LAB — D-DIMER, QUANTITATIVE: D-Dimer, Quant: <0.27 ug/mL-FEU (ref 0.00–0.50)

## 2021-04-24 NOTE — Discharge Instructions (Addendum)
You were evaluated in the Emergency Department and after careful evaluation, we did not find any emergent condition requiring admission or further testing in the hospital.  Your exam/testing today was overall reassuring.  Recommend follow-up with your primary care doctor and/or cardiology to further discuss your symptoms.  Please return to the Emergency Department if you experience any worsening of your condition.  Thank you for allowing [place] to be a part of your care.

## 2021-06-09 ENCOUNTER — Ambulatory Visit: Payer: Medicare Other | Admitting: Internal Medicine

## 2021-06-09 NOTE — Progress Notes (Incomplete)
Cardiology Office Note:    Date:  06/09/2021   ID:  Donnella Sham, DOB 1971/06/11, MRN [id number]  PCP:  System, Provider Not In   [facility] Providers Cardiologist:  None { Click to update primary MD,subspecialty MD or APP then REFRESH:1}    Referring MD: Sabas Sous, MD   No chief complaint on file. ***  History of Present Illness:    Nathaniel Estrada is a 50 y.o. male with a hx of ***  No past medical history on file.  No past surgical history on file.  EKG: NSR, RBBB  Current Medications: No outpatient medications have been marked as taking for the 06/09/21 encounter (Appointment) with Maisie Fus, MD.     Allergies:   Gabapentin, Levofloxacin, Lidocaine, Moxifloxacin, Cephalexin, and Tetanus toxoid   Social History   Socioeconomic History   Marital status: Single    Spouse name: Not on file   Number of children: Not on file   Years of education: Not on file   Highest education level: Not on file  Occupational History   Not on file  Tobacco Use   Smoking status: Not on file   Smokeless tobacco: Not on file  Substance and Sexual Activity   Alcohol use: Not on file   Drug use: Not on file   Sexual activity: Not on file  Other Topics Concern   Not on file  Social History Narrative   Not on file   Social Determinants of Health   Financial Resource Strain: Not on file  Food Insecurity: Not on file  Transportation Needs: Not on file  Physical Activity: Not on file  Stress: Not on file  Social Connections: Not on file     Family History: The patient's ***family history is not on file.  ROS:   Please see the history of present illness.    *** All other systems reviewed and are negative.  EKGs/Labs/Other Studies Reviewed:    The following studies were reviewed today: ***  EKG:  EKG is *** ordered today.  The ekg ordered today demonstrates ***  Recent Labs: 04/23/2021: ALT 35; BUN 14; Creatinine, Ser 1.23; Hemoglobin 13.3; Platelets 295;  Potassium 2.9; Sodium 137  Recent Lipid Panel No results found for: CHOL, TRIG, HDL, CHOLHDL, VLDL, LDLCALC, LDLDIRECT   Risk Assessment/Calculations:   {Does this patient have ATRIAL FIBRILLATION?:[phone]}       Physical Exam:    VS:  There were no vitals taken for this visit.    Wt Readings from Last 3 Encounters:  No data found for Wt     GEN: *** Well nourished, well developed in no acute distress HEENT: Normal NECK: No JVD; No carotid bruits LYMPHATICS: No lymphadenopathy CARDIAC: ***RRR, no murmurs, rubs, gallops RESPIRATORY:  Clear to auscultation without rales, wheezing or rhonchi  ABDOMEN: Soft, non-tender, non-distended MUSCULOSKELETAL:  No edema; No deformity  SKIN: Warm and dry NEUROLOGIC:  Alert and oriented x 3 PSYCHIATRIC:  Normal affect   ASSESSMENT:    No diagnosis found. PLAN:    In order of problems listed above:  ***      {Are you ordering a CV Procedure (e.g. stress test, cath, DCCV, TEE, etc)?   Press F2        :[id number]}    Medication Adjustments/Labs and Tests Ordered: Current medicines are reviewed at length with the patient today.  Concerns regarding medicines are outlined above.  No orders of the defined types were placed in this encounter.  No orders of the defined types were placed in this encounter.   There are no Patient Instructions on file for this visit.   Signed, Maisie Fus, MD  06/09/2021 10:06 AM    [facility]

## 2021-08-03 NOTE — Progress Notes (Deleted)
Cardiology Office Note:    Date:  08/03/2021   ID:  Nathaniel Estrada, DOB 04-26-1971, MRN [id number]  PCP:  System, Provider Not In  Cardiologist:  None  Electrophysiologist:  None   Referring MD: Sabas Sous, MD   No chief complaint on file. ***  History of Present Illness:    Nathaniel Estrada is a 51 y.o. male with a hx of hypertension, GERD, chronic right shoulder pain who presents as an ED follow-up for chest pain.  He was seen in the ED for chest pain on 04/23/2021.  Work-up unremarkable.  Troponin 14 > 13.  He was seen by Dr. Alvino Chapel cardiology in [facility] following ED visit.  Echocardiogram in [facility] on 05/21/2021 showed normal biventricular function, focal RV hypokinesis in mid to apical RV free wall, no significant valvular disease.  Cardiac MRI was suggested to further evaluate RV function as not well visualized on echo.    No past medical history on file.  No past surgical history on file.  Current Medications: No outpatient medications have been marked as taking for the 08/06/21 encounter (Appointment) with Little Ishikawa, MD.     Allergies:   Gabapentin, Levofloxacin, Lidocaine, Moxifloxacin, Cephalexin, and Tetanus toxoid   Social History   Socioeconomic History   Marital status: Single    Spouse name: Not on file   Number of children: Not on file   Years of education: Not on file   Highest education level: Not on file  Occupational History   Not on file  Tobacco Use   Smoking status: Not on file   Smokeless tobacco: Not on file  Substance and Sexual Activity   Alcohol use: Not on file   Drug use: Not on file   Sexual activity: Not on file  Other Topics Concern   Not on file  Social History Narrative   Not on file   Social Determinants of Health   Financial Resource Strain: Not on file  Food Insecurity: Not on file  Transportation Needs: Not on file  Physical Activity: Not on file  Stress: Not on file  Social Connections: Not on file      Family History: The patient's ***family history is not on file.  ROS:   Please see the history of present illness.    *** All other systems reviewed and are negative.  EKGs/Labs/Other Studies Reviewed:    The following studies were reviewed today: ***  EKG:  EKG is *** ordered today.  The ekg ordered today demonstrates ***  Recent Labs: 04/23/2021: ALT 35; BUN 14; Creatinine, Ser 1.23; Hemoglobin 13.3; Platelets 295; Potassium 2.9; Sodium 137  Recent Lipid Panel No results found for: CHOL, TRIG, HDL, CHOLHDL, VLDL, LDLCALC, LDLDIRECT  Physical Exam:    VS:  There were no vitals taken for this visit.    Wt Readings from Last 3 Encounters:  No data found for Wt     GEN: *** Well nourished, well developed in no acute distress HEENT: Normal NECK: No JVD; No carotid bruits LYMPHATICS: No lymphadenopathy CARDIAC: ***RRR, no murmurs, rubs, gallops RESPIRATORY:  Clear to auscultation without rales, wheezing or rhonchi  ABDOMEN: Soft, non-tender, non-distended MUSCULOSKELETAL:  No edema; No deformity  SKIN: Warm and dry NEUROLOGIC:  Alert and oriented x 3 PSYCHIATRIC:  Normal affect   ASSESSMENT:    No diagnosis found. PLAN:     Chest pain:  RV systolic dysfunction:Echocardiogram in [place] on 05/21/2021 showed normal biventricular function, focal RV  hypokinesis in mid to apical RV free wall, no significant valvular disease.  Cardiac MRI was suggested to further evaluate RV function as not well visualized on echo.  Hypertension: On amlodipine 5 mg daily, HCTZ 25 mg daily, losartan 100 mg daily  Morbid obesity: There is no height or weight on file to calculate BMI.   RTC in ***   Medication Adjustments/Labs and Tests Ordered: Current medicines are reviewed at length with the patient today.  Concerns regarding medicines are outlined above.  No orders of the defined types were placed in this encounter.  No orders of the defined types were placed in this  encounter.   There are no Patient Instructions on file for this visit.   Signed, Little Ishikawa, MD  08/03/2021 4:12 PM    [facility]

## 2021-08-06 ENCOUNTER — Ambulatory Visit: Payer: Medicare Other | Admitting: Cardiology

## 2021-12-31 ENCOUNTER — Other Ambulatory Visit: Payer: Self-pay

## 2021-12-31 ENCOUNTER — Emergency Department (HOSPITAL_BASED_OUTPATIENT_CLINIC_OR_DEPARTMENT_OTHER)
Admission: EM | Admit: 2021-12-31 | Discharge: 2022-01-01 | Disposition: A | Discharge status: Home / Self Care | Payer: Medicare Other | Attending: Emergency Medicine | Admitting: Emergency Medicine

## 2021-12-31 ENCOUNTER — Encounter (HOSPITAL_BASED_OUTPATIENT_CLINIC_OR_DEPARTMENT_OTHER): Payer: Self-pay | Admitting: Emergency Medicine

## 2021-12-31 DIAGNOSIS — R1031 Right lower quadrant pain: Secondary | ICD-10-CM | POA: Diagnosis not present

## 2021-12-31 HISTORY — DX: Pain in right shoulder: M25.511

## 2021-12-31 HISTORY — DX: Essential (primary) hypertension: I10

## 2021-12-31 LAB — CBC WITH DIFFERENTIAL/PLATELET
Abs Immature Granulocytes: 0.02 10*3/uL (ref 0.00–0.07)
Basophils Absolute: 0 10*3/uL (ref 0.0–0.1)
Basophils Relative: 1 %
Eosinophils Absolute: 0.3 10*3/uL (ref 0.0–0.5)
Eosinophils Relative: 4 %
HCT: 39.4 % (ref 39.0–52.0)
Hemoglobin: 13.1 g/dL (ref 13.0–17.0)
Immature Granulocytes: 0 %
Lymphocytes Relative: 30 %
Lymphs Abs: 1.7 10*3/uL (ref 0.7–4.0)
MCH: 28.9 pg (ref 26.0–34.0)
MCHC: 33.2 g/dL (ref 30.0–36.0)
MCV: 86.8 fL (ref 80.0–100.0)
Monocytes Absolute: 0.6 10*3/uL (ref 0.1–1.0)
Monocytes Relative: 10 %
Neutro Abs: 3.1 10*3/uL (ref 1.7–7.7)
Neutrophils Relative %: 55 %
Platelets: 236 10*3/uL (ref 150–400)
RBC: 4.54 MIL/uL (ref 4.22–5.81)
RDW: 14.3 % (ref 11.5–15.5)
WBC: 5.7 10*3/uL (ref 4.0–10.5)
nRBC: 0 % (ref 0.0–0.2)

## 2021-12-31 LAB — URINALYSIS, ROUTINE W REFLEX MICROSCOPIC
Bilirubin Urine: NEGATIVE
Glucose, UA: NEGATIVE mg/dL
Hgb urine dipstick: NEGATIVE
Ketones, ur: NEGATIVE mg/dL
Leukocytes,Ua: NEGATIVE
Nitrite: NEGATIVE
Protein, ur: NEGATIVE mg/dL
Specific Gravity, Urine: 1.025 (ref 1.005–1.030)
pH: 5.5 (ref 5.0–8.0)

## 2021-12-31 MED ORDER — FENTANYL CITRATE PF 50 MCG/ML IJ SOSY
50.0000 ug | PREFILLED_SYRINGE | Freq: Once | INTRAMUSCULAR | Status: AC
  Administered 2022-01-01: 50 ug via INTRAVENOUS
  Filled 2021-12-31: qty 1

## 2021-12-31 NOTE — ED Provider Notes (Signed)
[facility] EMERGENCY DEPARTMENT  Provider Note  CSN: [id number] Arrival date & time: 12/31/21 2214  History Chief Complaint  Patient presents with   Abdominal Pain    Nathaniel Estrada is a 51 y.o. male with prior R inguinal hernia repair in 2006 reports sudden onset of a 'funny feeling' in his R groin with increased pain a few hours ago while resting in bed. No recent heavy lifting or injury. No N/V/D or urinary symptoms. He is concerned his hernia mesh has ruptured. Wife at bedside reports it felt funny on that side when she was pushing on it.    Home Medications Prior to Admission medications   Not on File     Allergies    Gabapentin, Levofloxacin, Lidocaine, Moxifloxacin, Cephalexin, and Tetanus toxoid   Review of Systems   Review of Systems Please see HPI for pertinent positives and negatives  Physical Exam BP 103/68   Pulse 75   Temp 98.5 F (36.9 C) (Oral)   Resp 18   Ht 5\' 6"  (1.676 m)   Wt 136.1 kg   SpO2 98%   BMI 48.42 kg/m   Physical Exam Vitals and nursing note reviewed.  Constitutional:      Appearance: Normal appearance.  HENT:     Head: Normocephalic and atraumatic.     Nose: Nose normal.     Mouth/Throat:     Mouth: Mucous membranes are moist.  Eyes:     Extraocular Movements: Extraocular movements intact.     Conjunctiva/sclera: Conjunctivae normal.  Cardiovascular:     Rate and Rhythm: Normal rate.  Pulmonary:     Effort: Pulmonary effort is normal.     Breath sounds: Normal breath sounds.  Abdominal:     General: Abdomen is flat.     Palpations: Abdomen is soft.     Tenderness: There is abdominal tenderness in the right lower quadrant. There is no guarding. Negative signs include Murphy's sign and McBurney's sign.     Hernia: No hernia is present.  Genitourinary:    Penis: Normal.      Testes: Normal.        Right: Mass, tenderness or swelling not present.  Musculoskeletal:        General: No swelling. Normal range of  motion.     Cervical back: Neck supple.  Skin:    General: Skin is warm and dry.  Neurological:     General: No focal deficit present.     Mental Status: He is alert.  Psychiatric:        Mood and Affect: Mood normal.    ED Results / Procedures / Treatments   EKG None  Procedures Procedures  Medications Ordered in the ED Medications  fentaNYL (SUBLIMAZE) injection 50 mcg (50 mcg Intravenous Given 01/01/22 0004)  iohexol (OMNIPAQUE) 300 MG/ML solution 125 mL (125 mLs Intravenous Contrast Given 01/01/22 0014)    Initial Impression and Plan  Patient here with RLQ/R inguinal pain, remote history of hernia repair. Exam is benign. Will check labs and send for CT to eval complications of hernia, appendicitis, renal stone or other acute cause of his pain.   ED Course   Clinical Course as of 01/01/22 0055  Thu Dec 31, 2021  2327 UA is normal.  [CS]  2354 CBC is normal.  [CS]  Fri Jan 01, 2022  0010 BMP with Cr at baseline.  [CS]  0051 I personally viewed the images from radiology studies and agree with radiologist interpretation:  CT neg for acute process or signs of complication from prior hernia.   [CS]  [phone] Discussed results with patient and wife at bedside. No concerning findings on ED workup. Recommend outpatient PCP follow up if his pain continues. He has medications for chronic pain at home.  [CS]    Clinical Course User Index [CS] Pollyann Savoy, MD     MDM Rules/Calculators/A&P Medical Decision Making Given presenting complaint, I considered that admission might be necessary. After review of results from ED lab and/or imaging studies, admission to the hospital is not indicated at this time.    Amount and/or Complexity of Data Reviewed Labs: ordered. Decision-making details documented in ED Course. Radiology: ordered and independent interpretation performed. Decision-making details documented in ED Course.  Risk Prescription drug management. Parenteral controlled  substances. Decision regarding hospitalization.    Final Clinical Impression(s) / ED Diagnoses Final diagnoses:  Right inguinal pain    Rx / DC Orders ED Discharge Orders     None        Pollyann Savoy, MD 01/01/22 [phone]

## 2021-12-31 NOTE — ED Triage Notes (Signed)
Patient states he had a sudden onset of a "funny feeling" in his abdomen, states he feels like his hernia mesh has ruptured. Wife states his abdomen does not feel right to touch. C/o severe abdominal pain at present, denies n/v.

## 2022-01-01 ENCOUNTER — Emergency Department (HOSPITAL_BASED_OUTPATIENT_CLINIC_OR_DEPARTMENT_OTHER): Payer: Medicare Other

## 2022-01-01 LAB — BASIC METABOLIC PANEL
Anion gap: 6 (ref 5–15)
BUN: 13 mg/dL (ref 6–20)
CO2: 27 mmol/L (ref 22–32)
Calcium: 8.7 mg/dL — ABNORMAL LOW (ref 8.9–10.3)
Chloride: 107 mmol/L (ref 98–111)
Creatinine, Ser: 1.25 mg/dL — ABNORMAL HIGH (ref 0.61–1.24)
GFR, Estimated: >60 mL/min (ref >60)
Glucose, Bld: 93 mg/dL (ref 70–99)
Potassium: 3.5 mmol/L (ref 3.5–5.1)
Sodium: 140 mmol/L (ref 135–145)

## 2022-01-01 MED ORDER — IOHEXOL 300 MG/ML  SOLN
125.0000 mL | Freq: Once | INTRAMUSCULAR | Status: AC | PRN
  Administered 2022-01-01: 125 mL via INTRAVENOUS

## 2023-08-05 IMAGING — DX DG CHEST 1V PORT
1 series · 1 of 1 positions shown · non-contrast
Comparison: July 07, 2011

CLINICAL DATA: Chest pain.

EXAM:
PORTABLE CHEST 1 VIEW

[chest]
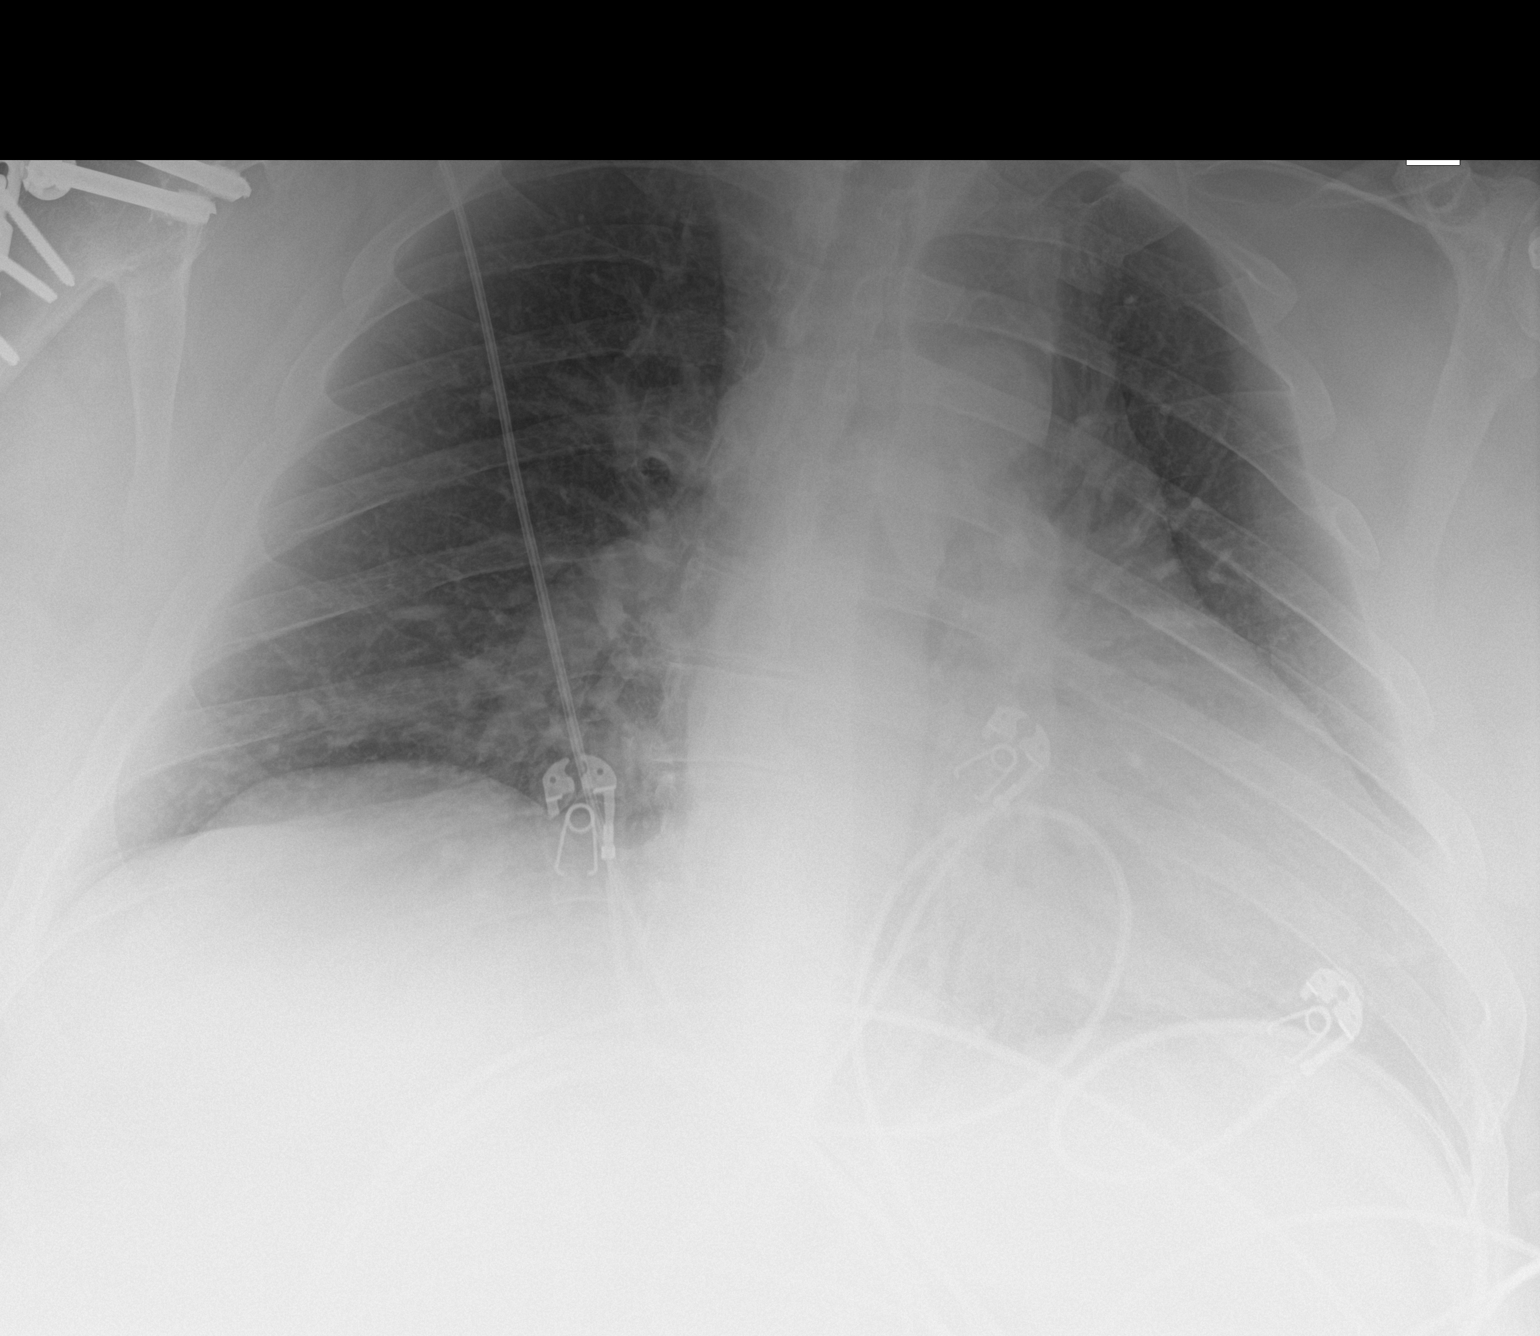

[1 of 1 positions shown; findings below may reference images not displayed]

FINDINGS: The cardiac silhouette is borderline in size. Both lungs are clear.
The visualized skeletal structures are unremarkable.
IMPRESSION: No active disease.

## 2023-10-15 ENCOUNTER — Other Ambulatory Visit: Payer: Self-pay

## 2023-10-15 ENCOUNTER — Emergency Department (HOSPITAL_BASED_OUTPATIENT_CLINIC_OR_DEPARTMENT_OTHER)

## 2023-10-15 ENCOUNTER — Encounter (HOSPITAL_BASED_OUTPATIENT_CLINIC_OR_DEPARTMENT_OTHER): Payer: Self-pay | Admitting: Emergency Medicine

## 2023-10-15 DIAGNOSIS — S93402A Sprain of unspecified ligament of left ankle, initial encounter: Secondary | ICD-10-CM | POA: Insufficient documentation

## 2023-10-15 DIAGNOSIS — S99912A Unspecified injury of left ankle, initial encounter: Secondary | ICD-10-CM | POA: Diagnosis present

## 2023-10-15 DIAGNOSIS — X501XXA Overexertion from prolonged static or awkward postures, initial encounter: Secondary | ICD-10-CM | POA: Insufficient documentation

## 2023-10-15 DIAGNOSIS — M25572 Pain in left ankle and joints of left foot: Secondary | ICD-10-CM | POA: Diagnosis not present

## 2023-10-15 NOTE — ED Triage Notes (Signed)
 Pt c/o LT ankle pain s/p turning it when he was getting out of car

## 2023-10-16 ENCOUNTER — Emergency Department (HOSPITAL_BASED_OUTPATIENT_CLINIC_OR_DEPARTMENT_OTHER)
Admission: EM | Admit: 2023-10-16 | Discharge: 2023-10-16 | Disposition: A | Discharge status: Home / Self Care | Attending: Emergency Medicine | Admitting: Emergency Medicine

## 2023-10-16 DIAGNOSIS — S93402A Sprain of unspecified ligament of left ankle, initial encounter: Secondary | ICD-10-CM

## 2023-10-16 NOTE — Discharge Instructions (Addendum)
 Apply ice for 30 minutes at a time, 4 times a day.  Continue taking celecoxib (Celebrex).  For additional pain relief, you may add acetaminophen.  Use crutches as needed.

## 2023-10-16 NOTE — ED Provider Notes (Signed)
  EMERGENCY DEPARTMENT AT [facility] Provider Note   CSN: [id number] Arrival date & time: 10/15/23  2155     History  Chief Complaint  Patient presents with   Ankle Pain    Nathaniel Estrada is a 53 y.o. male.  The history is provided by the patient.  Ankle Pain He has history of hypertension and comes in after injuring his left ankle.  He states that as he put his foot down getting out of the car, and his ankle rolled with an inversion injury.  He is complaining of pain mainly in the lateral aspect of the ankle but some pain also on the medial aspect.  He is unable to bear weight.   Home Medications Prior to Admission medications   Not on File      Allergies    Gabapentin, Levofloxacin, Lidocaine, Moxifloxacin, Cephalexin, and Tetanus toxoid    Review of Systems   Review of Systems  All other systems reviewed and are negative.   Physical Exam Updated Vital Signs BP (!) 141/88 (BP Location: Left Arm)   Pulse (!) 103   Temp 97.7 F (36.5 C)   Resp 18   Ht 5' 6.5" (1.689 m)   Wt (!) 145.2 kg   SpO2 99%   BMI 50.88 kg/m  Physical Exam Vitals and nursing note reviewed.   53 year old male, resting comfortably and in no acute distress. Vital signs are significant for borderline elevated blood pressure and borderline elevated heart rate. Oxygen saturation is 99%, which is normal. Head is normocephalic and atraumatic. PERRLA, EOMI.  Lungs are clear without rales, wheezes, or rhonchi. Chest is nontender. Heart has regular rate and rhythm without murmur. Abdomen is soft, flat, nontender. Extremities: There is mild swelling of the lateral aspect of the left ankle.  There is moderate tenderness over the lateral aspect of the left ankle, mild tenderness over the medial aspect.  There is no tenderness to palpation over the proximal fibula or over the fifth metatarsal.  There is no instability of the ankle mortise, anterior drawer sign is negative.  Distal  neurovascular exam is intact with strong pulses, normal sensation, prompt capillary refill. Skin is warm and dry without rash. Neurologic: Mental status is normal, cranial nerves are intact, moves all extremities equally.  ED Results / Procedures / Treatments    Radiology DG Ankle Complete Left Result Date: 10/15/2023 CLINICAL DATA:  Status post trauma. EXAM: LEFT ANKLE COMPLETE - 3+ VIEW COMPARISON:  None Available. FINDINGS: There is no evidence of fracture, dislocation, or joint effusion. There is no evidence of significant arthropathy or other focal bone abnormality. Mild diffuse soft tissue swelling is seen. IMPRESSION: Mild diffuse soft tissue swelling without evidence of an acute osseous abnormality. Electronically Signed   By: Aram Candela M.D.   On: 10/15/2023 22:45    Procedures .Ortho Injury Treatment  Date/Time: 10/16/2023 1:27 AM  Performed by: Dione Booze, MD Authorized by: Dione Booze, MD   Consent:    Consent obtained:  Verbal   Consent given by:  Patient   Risks discussed:  Restricted joint movement and stiffness   Alternatives discussed:  No treatmentInjury location: ankle Location details: left ankle Injury type: soft tissue Pre-procedure neurovascular assessment: neurovascularly intact Pre-procedure distal perfusion: normal Pre-procedure neurological function: normal Pre-procedure range of motion: reduced  Anesthesia: Local anesthesia used: no  Patient sedated: NoImmobilization: brace Supplies used: Ankle splint orthotic. Post-procedure distal perfusion: normal Post-procedure neurological function: normal Post-procedure range of  motion: unchanged       Medications Ordered in ED Medications - No data to display  ED Course/ Medical Decision Making/ A&P                                 Medical Decision Making Amount and/or Complexity of Data Reviewed Radiology: ordered.   Left ankle injury which is most likely sprain, rule out fracture.   X-rays show no evidence of fracture.  Have independently viewed the images, and agree with the radiologist's interpretation.  I have ordered ankle splint orthotic be applied and crutches be given.  I have advised him to use crutches until he is able to bear weight comfortably.  I have referred him to orthopedics for follow-up.  He does take celecoxib on a regular basis, advised to add acetaminophen as needed for additional pain relief.  Final Clinical Impression(s) / ED Diagnoses Final diagnoses:  Sprain of left ankle, unspecified ligament, initial encounter    Rx / DC Orders ED Discharge Orders     None         Dione Booze, MD 10/16/23 0127

## 2024-04-14 IMAGING — CT CT ABD-PELV W/ CM
2 of 5 series · 16 of 46 positions shown, 18 images · IV contrast (agent unspecified)
Comparison: CT abdomen and pelvis 10/20/2006.

CLINICAL DATA: Right-sided inguinal pain and swelling. Prior hernia
repair.

EXAM:
CT ABDOMEN AND PELVIS WITH CONTRAST
TECHNIQUE: Multidetector CT imaging of the abdomen and pelvis was performed
using the standard protocol following bolus administration of
intravenous contrast.

[Series 2: axial st · axial · 0.98mm/px · z∈[+752,+1162]mm · 13 of 92 slices shown, 15 images]
[im 5/92  soft-tissue]
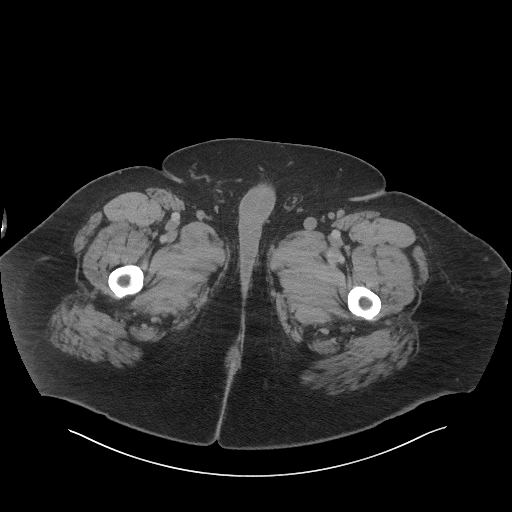
[im 5/92  bone]
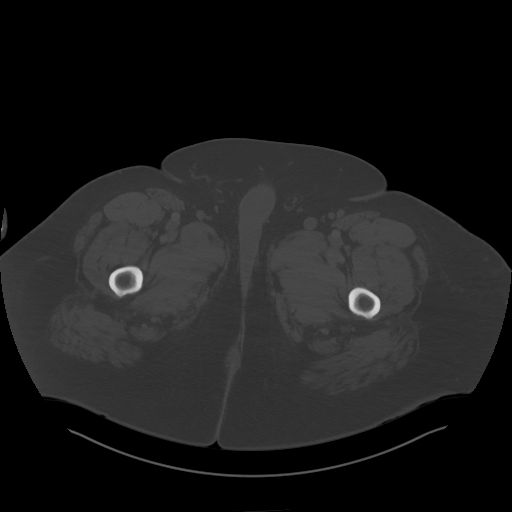
[im 15/92  soft-tissue]
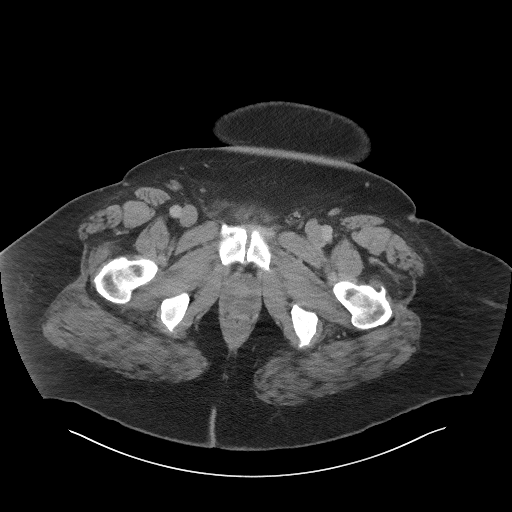
[im 20/92  soft-tissue]
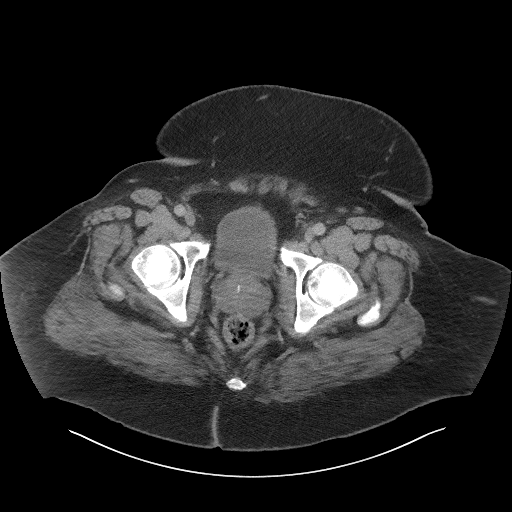
[im 24/92  soft-tissue]
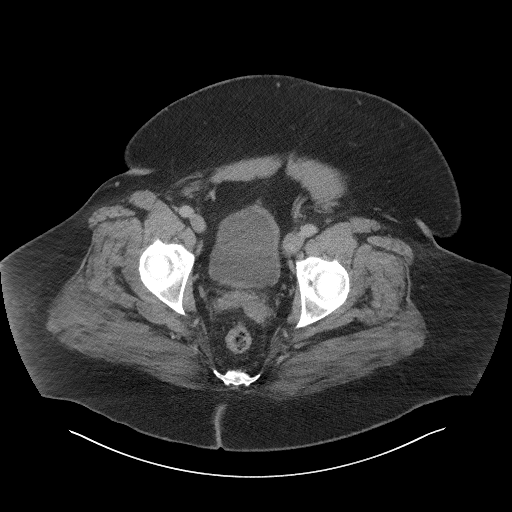
[im 34/92  soft-tissue]
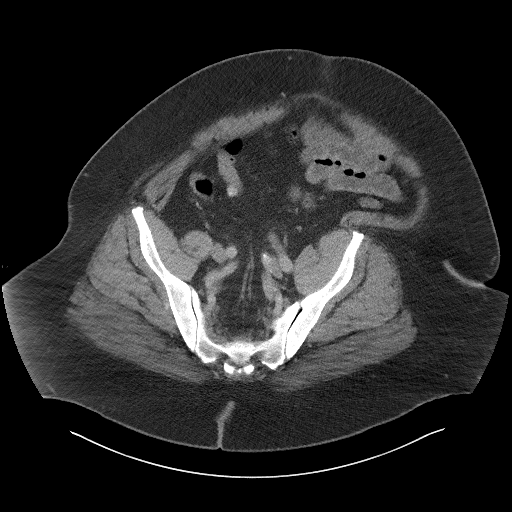
[im 39/92  soft-tissue]
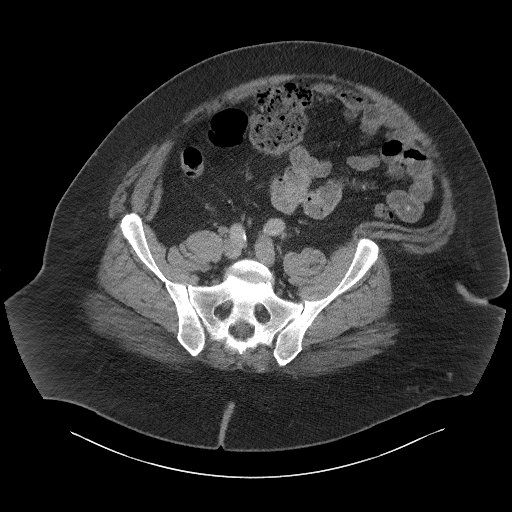
[im 48/92  soft-tissue]
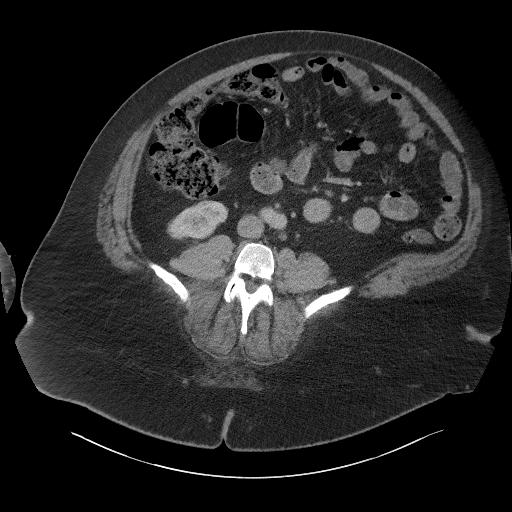
[im 53/92  soft-tissue]
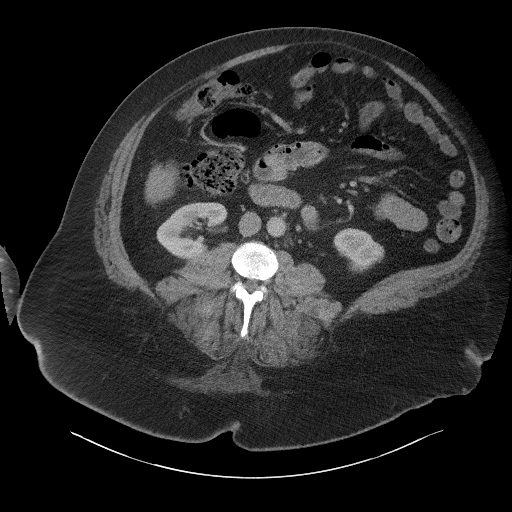
[im 58/92  soft-tissue]
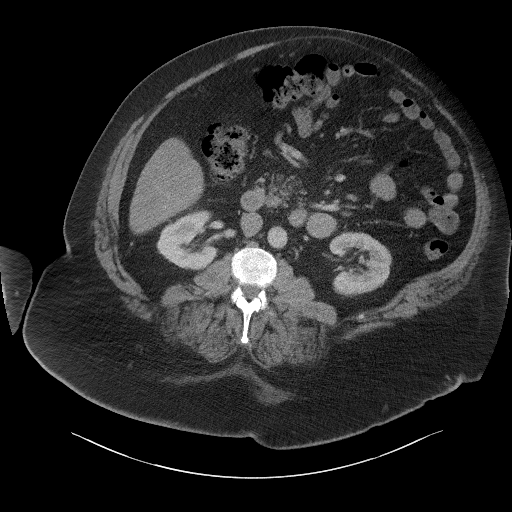
[im 58/92  bone]
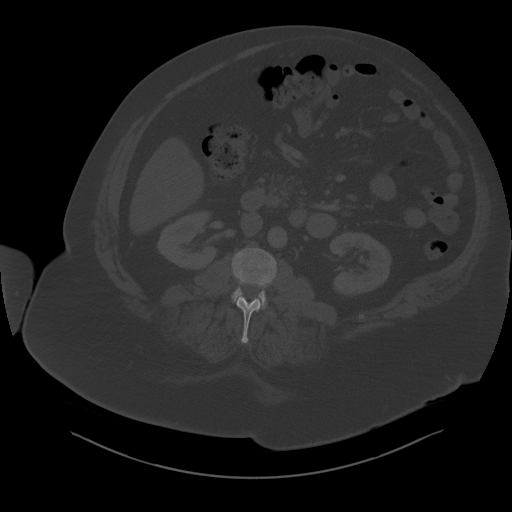
[im 68/92  soft-tissue]
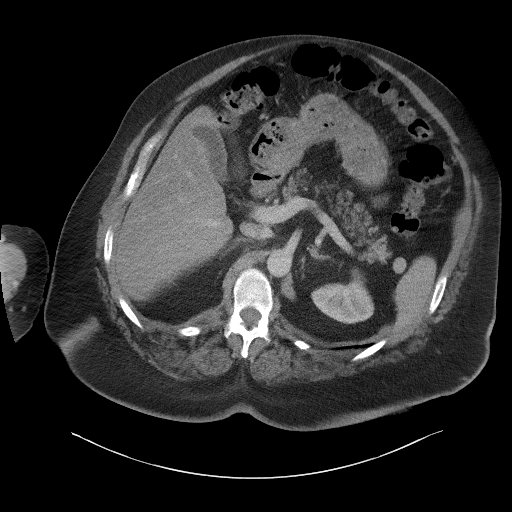
[im 72/92  soft-tissue]
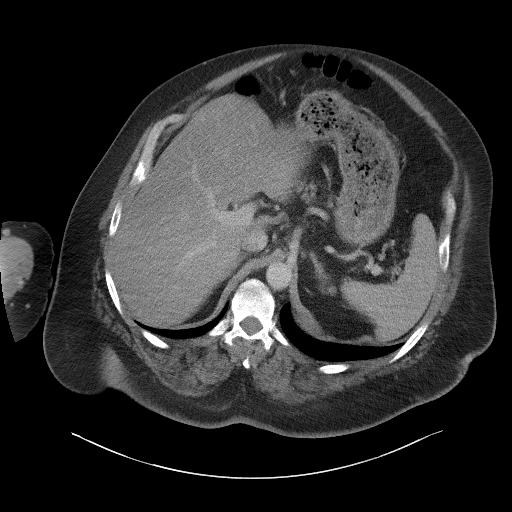
[im 77/92  soft-tissue]
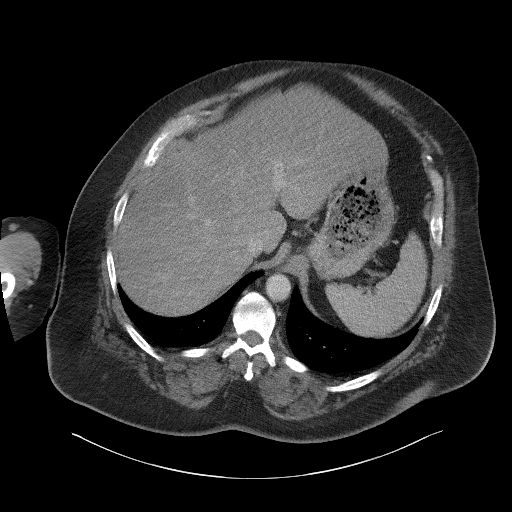
[im 87/92  soft-tissue]
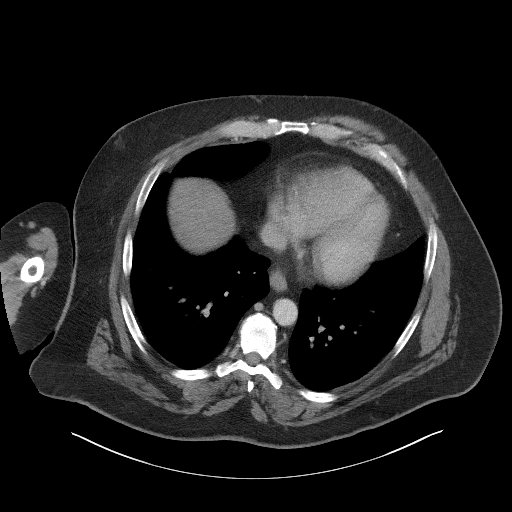

[Series 5: coronal st · coronal · 0.86mm/px · 3 of 128 slices shown]
[im 43/128  soft-tissue]
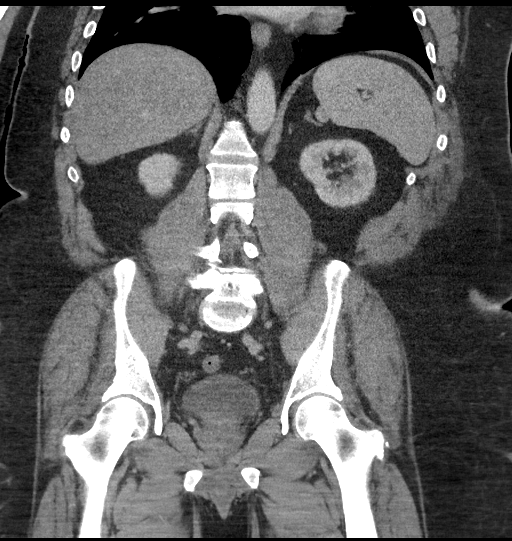
[im 57/128  soft-tissue]
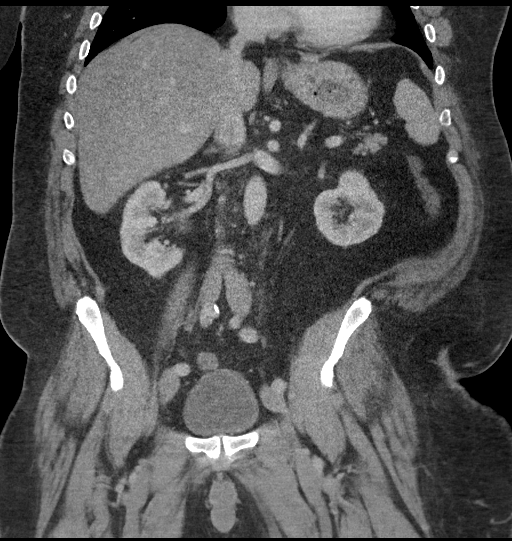
[im 71/128  soft-tissue]
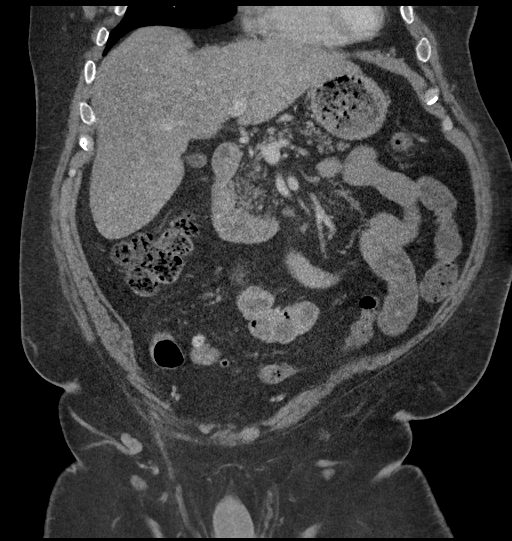

[16 of 46 positions shown; findings below may reference images not displayed]

RADIATION DOSE REDUCTION: This exam was performed according to the
departmental dose-optimization program which includes automated
exposure control, adjustment of the mA and/or kV according to
patient size and/or use of iterative reconstruction technique.

CONTRAST:  125mL OMNIPAQUE IOHEXOL 300 MG/ML  SOLN
FINDINGS: Lower chest: No acute abnormality.

Hepatobiliary: Mild diffuse fatty infiltration of the liver.
Gallbladder and bile ducts are within normal limits.

Pancreas: Unremarkable. No pancreatic ductal dilatation or
surrounding inflammatory changes.

Spleen: Normal in size without focal abnormality.

Adrenals/Urinary Tract: Adrenal glands are unremarkable. Kidneys are
normal, without renal calculi, focal lesion, or hydronephrosis.
Bladder is unremarkable.

Stomach/Bowel: Stomach is within normal limits. No evidence of bowel
wall thickening, distention, or inflammatory changes. The appendix
is not seen. There is sigmoid colon diverticulosis.

Vascular/Lymphatic: Aortic atherosclerosis. No enlarged abdominal or
pelvic lymph nodes.

Reproductive: Prostate is unremarkable.

Other: There is a small fat containing umbilical hernia. There is no
inguinal hernia. There is no ascites.

Musculoskeletal: There are degenerative changes of the lower lumbar
spine.
IMPRESSION: 1. No acute findings to explain patient's right inguinal pain.
2. Colonic diverticulosis without evidence for diverticulitis.
3. Fatty infiltration of the liver.
4.  Aortic Atherosclerosis ([id number]).
